# Patient Record
Sex: Female | Born: 1975 | Hispanic: Yes | Marital: Married | Smoking: Never smoker
Health system: Southern US, Community
[De-identification: ages and names within clinical notes are randomized; demographics above are authoritative.]

## PROBLEM LIST (undated history)

## (undated) ENCOUNTER — Emergency Department: Admission: EM

## (undated) DIAGNOSIS — N289 Disorder of kidney and ureter, unspecified: Secondary | ICD-10-CM

## (undated) DIAGNOSIS — I10 Essential (primary) hypertension: Secondary | ICD-10-CM

## (undated) HISTORY — PX: CHOLECYSTECTOMY: SHX55

---

## 2006-07-27 ENCOUNTER — Emergency Department: Payer: Self-pay | Admitting: Emergency Medicine

## 2006-07-27 ENCOUNTER — Other Ambulatory Visit: Payer: Self-pay

## 2016-07-04 ENCOUNTER — Emergency Department
Admission: EM | Admit: 2016-07-04 | Discharge: 2016-07-05 | Disposition: A | Discharge status: Home / Self Care | Payer: Self-pay | Attending: Emergency Medicine | Admitting: Emergency Medicine

## 2016-07-04 ENCOUNTER — Encounter: Payer: Self-pay | Admitting: Emergency Medicine

## 2016-07-04 DIAGNOSIS — R0602 Shortness of breath: Secondary | ICD-10-CM | POA: Insufficient documentation

## 2016-07-04 DIAGNOSIS — R079 Chest pain, unspecified: Secondary | ICD-10-CM | POA: Insufficient documentation

## 2016-07-04 DIAGNOSIS — R55 Syncope and collapse: Secondary | ICD-10-CM | POA: Insufficient documentation

## 2016-07-04 DIAGNOSIS — R000 Tachycardia, unspecified: Secondary | ICD-10-CM | POA: Insufficient documentation

## 2016-07-04 DIAGNOSIS — Z5181 Encounter for therapeutic drug level monitoring: Secondary | ICD-10-CM | POA: Insufficient documentation

## 2016-07-04 DIAGNOSIS — I10 Essential (primary) hypertension: Secondary | ICD-10-CM | POA: Insufficient documentation

## 2016-07-04 HISTORY — DX: Disorder of kidney and ureter, unspecified: N28.9

## 2016-07-04 HISTORY — DX: Essential (primary) hypertension: I10

## 2016-07-04 LAB — CBC WITH DIFFERENTIAL/PLATELET
BASOS ABS: 0 10*3/uL (ref 0–0.1)
BASOS PCT: 0 %
EOS ABS: 0.3 10*3/uL (ref 0–0.7)
Eosinophils Relative: 4 %
HEMATOCRIT: 39.3 % (ref 35.0–47.0)
HEMOGLOBIN: 13.3 g/dL (ref 12.0–16.0)
Lymphocytes Relative: 29 %
Lymphs Abs: 2.2 10*3/uL (ref 1.0–3.6)
MCH: 27.5 pg (ref 26.0–34.0)
MCHC: 33.7 g/dL (ref 32.0–36.0)
MCV: 81.6 fL (ref 80.0–100.0)
MONOS PCT: 7 %
Monocytes Absolute: 0.5 10*3/uL (ref 0.2–0.9)
NEUTROS ABS: 4.7 10*3/uL (ref 1.4–6.5)
NEUTROS PCT: 60 %
Platelets: 285 10*3/uL (ref 150–440)
RBC: 4.82 MIL/uL (ref 3.80–5.20)
RDW: 14.3 % (ref 11.5–14.5)
WBC: 7.8 10*3/uL (ref 3.6–11.0)

## 2016-07-04 LAB — POCT PREGNANCY, URINE: PREG TEST UR: NEGATIVE

## 2016-07-04 MED ORDER — SODIUM CHLORIDE 0.9 % IV BOLUS (SEPSIS)
1000.0000 mL | Freq: Once | INTRAVENOUS | Status: AC
  Administered 2016-07-04: 1000 mL via INTRAVENOUS
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
## 2016-07-05 LAB — URINE DRUG SCREEN, QUALITATIVE (ARMC ONLY)
AMPHETAMINES, UR SCREEN: NOT DETECTED
BENZODIAZEPINE, UR SCRN: POSITIVE — AB
Barbiturates, Ur Screen: NOT DETECTED
Cannabinoid 50 Ng, Ur ~~LOC~~: NOT DETECTED
Cocaine Metabolite,Ur ~~LOC~~: NOT DETECTED
MDMA (Ecstasy)Ur Screen: NOT DETECTED
METHADONE SCREEN, URINE: NOT DETECTED
Opiate, Ur Screen: NOT DETECTED
Phencyclidine (PCP) Ur S: NOT DETECTED
Tricyclic, Ur Screen: NOT DETECTED

## 2016-07-05 LAB — URINALYSIS COMPLETE WITH MICROSCOPIC (ARMC ONLY)
BILIRUBIN URINE: NEGATIVE
GLUCOSE, UA: NEGATIVE mg/dL
KETONES UR: NEGATIVE mg/dL
NITRITE: NEGATIVE
PH: 5 (ref 5.0–8.0)
PROTEIN: 100 mg/dL — AB
Specific Gravity, Urine: 1.016 (ref 1.005–1.030)

## 2016-07-05 LAB — BASIC METABOLIC PANEL
Anion gap: 14 (ref 5–15)
BUN: 13 mg/dL (ref 6–20)
CHLORIDE: 109 mmol/L (ref 101–111)
CO2: 18 mmol/L — AB (ref 22–32)
Calcium: 8.5 mg/dL — ABNORMAL LOW (ref 8.9–10.3)
Creatinine, Ser: 0.78 mg/dL (ref 0.44–1.00)
GFR calc non Af Amer: >60 mL/min (ref >60)
Glucose, Bld: 98 mg/dL (ref 65–99)
POTASSIUM: 3.1 mmol/L — AB (ref 3.5–5.1)
SODIUM: 141 mmol/L (ref 135–145)

## 2016-07-05 LAB — TROPONIN I

## 2016-07-05 MED ORDER — ACETAMINOPHEN 325 MG PO TABS
650.0000 mg | ORAL_TABLET | Freq: Once | ORAL | Status: AC
  Administered 2016-07-05: 650 mg via ORAL
  Filled 2016-07-05: qty 2

## 2016-07-05 NOTE — ED Notes (Signed)
Discharge instructions reviewed with patient. Questions fielded by this RN. Patient verbalizes understanding of instructions. Patient discharged home in stable condition per Goodman MD . No acute distress noted at time of discharge.   

## 2016-07-05 NOTE — ED Notes (Signed)
Report from NordstromKendall RN at [phone]

## 2016-07-05 NOTE — Discharge Instructions (Signed)
Please seek medical attention for any high fevers, chest pain, shortness of breath, change in behavior, persistent vomiting, bloody stool or any other new or concerning symptoms.  

## 2016-07-05 NOTE — ED Notes (Signed)
Pt now c/o of L shoulder pain. Dr. Derrill KayGoodman made aware.

## 2017-01-02 ENCOUNTER — Encounter: Payer: Self-pay | Admitting: Emergency Medicine

## 2017-01-02 ENCOUNTER — Emergency Department
Admission: EM | Admit: 2017-01-02 | Discharge: 2017-01-02 | Disposition: A | Discharge status: Home / Self Care | Payer: Self-pay | Attending: Emergency Medicine | Admitting: Emergency Medicine

## 2017-01-02 ENCOUNTER — Emergency Department: Payer: Self-pay

## 2017-01-02 DIAGNOSIS — J069 Acute upper respiratory infection, unspecified: Secondary | ICD-10-CM | POA: Insufficient documentation

## 2017-01-02 DIAGNOSIS — I10 Essential (primary) hypertension: Secondary | ICD-10-CM | POA: Insufficient documentation

## 2017-01-02 DIAGNOSIS — J111 Influenza due to unidentified influenza virus with other respiratory manifestations: Secondary | ICD-10-CM

## 2017-01-02 DIAGNOSIS — R69 Illness, unspecified: Secondary | ICD-10-CM

## 2017-01-02 DIAGNOSIS — R112 Nausea with vomiting, unspecified: Secondary | ICD-10-CM

## 2017-01-02 DIAGNOSIS — R197 Diarrhea, unspecified: Secondary | ICD-10-CM | POA: Insufficient documentation

## 2017-01-02 LAB — INFLUENZA PANEL BY PCR (TYPE A & B)
Influenza A By PCR: NEGATIVE
Influenza B By PCR: NEGATIVE

## 2017-01-02 MED ORDER — IBUPROFEN 800 MG PO TABS
800.0000 mg | ORAL_TABLET | Freq: Once | ORAL | Status: AC
  Administered 2017-01-02: 800 mg via ORAL
  Filled 2017-01-02: qty 1

## 2017-01-02 MED ORDER — ACETAMINOPHEN 500 MG PO TABS
1000.0000 mg | ORAL_TABLET | Freq: Once | ORAL | Status: AC
  Administered 2017-01-02: 1000 mg via ORAL
  Filled 2017-01-02: qty 2

## 2017-01-02 MED ORDER — BENZONATATE 100 MG PO CAPS: 100.0000 mg | ORAL_CAPSULE | Freq: Four times a day (QID) | ORAL | 0 refills | Status: DC | PRN

## 2017-01-02 MED ORDER — ONDANSETRON 4 MG PO TBDP: 4.0000 mg | ORAL_TABLET | Freq: Three times a day (TID) | ORAL | 0 refills | Status: DC | PRN

## 2017-01-02 NOTE — ED Triage Notes (Signed)
Pt presents to ED with frequent non-productive cough, fever, headache, and body aches. Onset last night before bed.

## 2017-01-02 NOTE — ED Provider Notes (Signed)
[facility] Emergency Department Provider Note   ____________________________________________   First MD Initiated Contact with Patient 01/02/17 [phone]     (approximate)  I have reviewed the triage vital signs and the nursing notes.   HISTORY  Chief Complaint Fever; Generalized Body Aches; Headache; and Cough    HPI Michaela Chambers is a 41 y.o. female who comes into the hospital today with high fever, body aches, headache, sore throat, nausea vomiting and diarrhea. The patient reports that the symptoms started last night. The diarrhea started Sunday. She has not taken anything for pain or for fever at home. She has had blood pressure and doesn't want the patient to cause her blood pressure to be higher. The patient reports that her temperature has been up to 105. She's had some occasional shortness of breath. The patient denies any sick contacts. She's had a cough. She is here today for evaluation.   Past Medical History:  Diagnosis Date  . Hypertension   . Renal disorder     There are no active problems to display for this patient.   Past Surgical History:  Procedure Laterality Date  . CESAREAN SECTION      Prior to Admission medications   Medication Sig Start Date End Date Taking? Authorizing Provider  benzonatate (TESSALON PERLES) 100 MG capsule Take 1 capsule (100 mg total) by mouth every 6 (six) hours as needed for cough. 01/02/17   Rebecka ApleyAllison P Webster, MD  ondansetron (ZOFRAN ODT) 4 MG disintegrating tablet Take 1 tablet (4 mg total) by mouth every 8 (eight) hours as needed for nausea or vomiting. 01/02/17   Rebecka ApleyAllison P Webster, MD    Allergies Pineapple  No family history on file.  Social History Social History  Substance Use Topics  . Smoking status: Never Smoker  . Smokeless tobacco: Never Used  . Alcohol use Yes    Review of Systems Constitutional:  fever/chills Eyes: No visual changes. ENT: No sore throat. Cardiovascular: Denies  chest pain. Respiratory: cough and shortness of breath. Gastrointestinal: Nausea, vomiting and diarrhea with No abdominal pain. No constipation. Genitourinary: Negative for dysuria. Musculoskeletal: body aches Skin: Negative for rash. Neurological: headache  10-point ROS otherwise negative.  ____________________________________________   PHYSICAL EXAM:  VITAL SIGNS: ED Triage Vitals  Enc Vitals Group     BP 01/02/17 0505 (!) 146/102     Pulse Rate 01/02/17 0505 86     Resp 01/02/17 0505 20     Temp 01/02/17 0505 (!) 100.4 F (38 C)     Temp Source 01/02/17 0505 Oral     SpO2 01/02/17 0505 97 %     Weight 01/02/17 0506 210 lb (95.3 kg)     Height 01/02/17 0506 5\' 8"  (1.727 m)     Head Circumference --      Peak Flow --      Pain Score 01/02/17 0506 10     Pain Loc --      Pain Edu? --      Excl. in GC? --     Constitutional: Alert and oriented. Ill appearing and in moderate distress. Eyes: Conjunctivae are normal. PERRL. EOMI. Head: Atraumatic. Nose: No congestion/rhinnorhea. Mouth/Throat: Mucous membranes are moist.  Oropharynx non-erythematous. Cardiovascular: Normal rate, regular rhythm. Grossly normal heart sounds.  Good peripheral circulation. Respiratory: Normal respiratory effort.  No retractions. Lungs CTAB. Gastrointestinal: Soft and nontender. No distention. Positive bowel sounds Musculoskeletal: No lower extremity tenderness nor edema.   Neurologic:  Normal speech and language.  Skin:  Skin is warm, dry and intact.  Psychiatric: Mood and affect are normal.   ____________________________________________   LABS (all labs ordered are listed, but only abnormal results are displayed)  Labs Reviewed  INFLUENZA PANEL BY PCR (TYPE A & B)   ____________________________________________  EKG  none ____________________________________________  RADIOLOGY  CXR ____________________________________________   PROCEDURES  Procedure(s) performed:  None  Procedures  Critical Care performed: No  ____________________________________________   INITIAL IMPRESSION / ASSESSMENT AND PLAN / ED COURSE  Pertinent labs & imaging results that were available during my care of the patient were reviewed by me and considered in my medical decision making (see chart for details).  This is a 41 year old female who comes into the hospital today with flulike symptoms. She's having fever bodyaches headache sore throat. The patient reports her temp was really high at 105. She was 100.4 here in the emergency department. We did give her a dose of ibuprofen and Tylenol. I will send the patient for chest x-ray as well as a flu swab. I will reassess the patient.  Clinical Course as of Jan 03 652  Tue Jan 02, 2017  0614 No active cardiopulmonary disease. DG Chest 2 View [AW]    Clinical Course User Index [AW] Rebecka Apley, MD   The patient's chest x-ray does not show pneumonia and her flu swab was negative. She'll be discharged home to follow-up with her primary care physician or the acute care clinic.  ____________________________________________   FINAL CLINICAL IMPRESSION(S) / ED DIAGNOSES  Final diagnoses:  Influenza-like illness  Viral upper respiratory tract infection  Nausea vomiting and diarrhea      NEW MEDICATIONS STARTED DURING THIS VISIT:  New Prescriptions   BENZONATATE (TESSALON PERLES) 100 MG CAPSULE    Take 1 capsule (100 mg total) by mouth every 6 (six) hours as needed for cough.   ONDANSETRON (ZOFRAN ODT) 4 MG DISINTEGRATING TABLET    Take 1 tablet (4 mg total) by mouth every 8 (eight) hours as needed for nausea or vomiting.     Note:  This document was prepared using Dragon voice recognition software and may include unintentional dictation errors.    Rebecka Apley, MD 01/02/17 [phone]

## 2017-01-08 ENCOUNTER — Encounter: Payer: Self-pay | Admitting: Emergency Medicine

## 2017-01-08 ENCOUNTER — Emergency Department
Admission: EM | Admit: 2017-01-08 | Discharge: 2017-01-08 | Disposition: A | Discharge status: Home / Self Care | Payer: Self-pay | Attending: Emergency Medicine | Admitting: Emergency Medicine

## 2017-01-08 DIAGNOSIS — Z791 Long term (current) use of non-steroidal anti-inflammatories (NSAID): Secondary | ICD-10-CM | POA: Insufficient documentation

## 2017-01-08 DIAGNOSIS — R112 Nausea with vomiting, unspecified: Secondary | ICD-10-CM

## 2017-01-08 DIAGNOSIS — I10 Essential (primary) hypertension: Secondary | ICD-10-CM | POA: Insufficient documentation

## 2017-01-08 DIAGNOSIS — F1010 Alcohol abuse, uncomplicated: Secondary | ICD-10-CM | POA: Insufficient documentation

## 2017-01-08 DIAGNOSIS — K2920 Alcoholic gastritis without bleeding: Secondary | ICD-10-CM | POA: Insufficient documentation

## 2017-01-08 LAB — COMPREHENSIVE METABOLIC PANEL
ALK PHOS: 65 U/L (ref 38–126)
ALT: 33 U/L (ref 14–54)
AST: 29 U/L (ref 15–41)
Albumin: 4.2 g/dL (ref 3.5–5.0)
Anion gap: 9 (ref 5–15)
BUN: 13 mg/dL (ref 6–20)
CALCIUM: 8.4 mg/dL — AB (ref 8.9–10.3)
CHLORIDE: 106 mmol/L (ref 101–111)
CO2: 25 mmol/L (ref 22–32)
CREATININE: 0.72 mg/dL (ref 0.44–1.00)
GFR calc non Af Amer: >60 mL/min (ref >60)
GLUCOSE: 116 mg/dL — AB (ref 65–99)
Potassium: 3.9 mmol/L (ref 3.5–5.1)
SODIUM: 140 mmol/L (ref 135–145)
Total Bilirubin: 1.4 mg/dL — ABNORMAL HIGH (ref 0.3–1.2)
Total Protein: 7.5 g/dL (ref 6.5–8.1)

## 2017-01-08 LAB — URINALYSIS, COMPLETE (UACMP) WITH MICROSCOPIC
Bacteria, UA: NONE SEEN
Bilirubin Urine: NEGATIVE
GLUCOSE, UA: NEGATIVE mg/dL
Hgb urine dipstick: NEGATIVE
KETONES UR: 5 mg/dL — AB
Nitrite: NEGATIVE
PH: 6 (ref 5.0–8.0)
Protein, ur: NEGATIVE mg/dL
SPECIFIC GRAVITY, URINE: 1.021 (ref 1.005–1.030)

## 2017-01-08 LAB — POCT PREGNANCY, URINE: PREG TEST UR: NEGATIVE

## 2017-01-08 LAB — CBC
HCT: 39.2 % (ref 35.0–47.0)
Hemoglobin: 13.6 g/dL (ref 12.0–16.0)
MCH: 27.6 pg (ref 26.0–34.0)
MCHC: 34.8 g/dL (ref 32.0–36.0)
MCV: 79.4 fL — AB (ref 80.0–100.0)
PLATELETS: 284 10*3/uL (ref 150–440)
RBC: 4.94 MIL/uL (ref 3.80–5.20)
RDW: 14.3 % (ref 11.5–14.5)
WBC: 7.5 10*3/uL (ref 3.6–11.0)

## 2017-01-08 LAB — ETHANOL: Alcohol, Ethyl (B): <5 mg/dL (ref <5)

## 2017-01-08 LAB — LIPASE, BLOOD: Lipase: 24 U/L (ref 11–51)

## 2017-01-08 MED ORDER — FAMOTIDINE IN NACL 20-0.9 MG/50ML-% IV SOLN
20.0000 mg | Freq: Once | INTRAVENOUS | Status: AC
  Administered 2017-01-08: 20 mg via INTRAVENOUS
  Filled 2017-01-08: qty 50

## 2017-01-08 MED ORDER — ONDANSETRON HCL 4 MG/2ML IJ SOLN
4.0000 mg | Freq: Once | INTRAMUSCULAR | Status: AC | PRN
  Administered 2017-01-08: 4 mg via INTRAVENOUS
  Filled 2017-01-08: qty 2

## 2017-01-08 MED ORDER — SODIUM CHLORIDE 0.9 % IV BOLUS (SEPSIS)
1000.0000 mL | Freq: Once | INTRAVENOUS | Status: AC
  Administered 2017-01-08: 1000 mL via INTRAVENOUS

## 2017-01-08 MED ORDER — ONDANSETRON HCL 4 MG/2ML IJ SOLN
4.0000 mg | Freq: Once | INTRAMUSCULAR | Status: AC
  Administered 2017-01-08: 4 mg via INTRAVENOUS
  Filled 2017-01-08: qty 2

## 2017-01-08 MED ORDER — GI COCKTAIL ~~LOC~~
30.0000 mL | Freq: Once | ORAL | Status: AC
  Administered 2017-01-08: 30 mL via ORAL
  Filled 2017-01-08: qty 30

## 2017-01-08 MED ORDER — FAMOTIDINE 20 MG PO TABS: 20.0000 mg | ORAL_TABLET | Freq: Two times a day (BID) | ORAL | 1 refills | Status: DC

## 2017-01-08 MED ORDER — ONDANSETRON HCL 4 MG PO TABS: 4.0000 mg | ORAL_TABLET | Freq: Three times a day (TID) | ORAL | 0 refills | Status: DC | PRN

## 2017-01-08 MED ORDER — ACETAMINOPHEN 500 MG PO TABS
1000.0000 mg | ORAL_TABLET | Freq: Once | ORAL | Status: AC
  Administered 2017-01-08: 1000 mg via ORAL
  Filled 2017-01-08: qty 2

## 2017-01-08 NOTE — ED Triage Notes (Signed)
Pt assisted to wheelchair upon arrival; at stat registration desk pt began vomiting; head resting on counter; husband says pt was drinking alcohol last night and he thinks she "over did it";
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
## 2019-01-20 ENCOUNTER — Other Ambulatory Visit: Payer: Self-pay

## 2019-01-20 ENCOUNTER — Ambulatory Visit
Admission: EM | Admit: 2019-01-20 | Discharge: 2019-01-20 | Disposition: A | Discharge status: Home / Self Care | Payer: 59 | Attending: Internal Medicine | Admitting: Internal Medicine

## 2019-01-20 ENCOUNTER — Encounter: Payer: Self-pay | Admitting: Emergency Medicine

## 2019-01-20 DIAGNOSIS — G43009 Migraine without aura, not intractable, without status migrainosus: Secondary | ICD-10-CM

## 2019-01-20 DIAGNOSIS — A084 Viral intestinal infection, unspecified: Secondary | ICD-10-CM

## 2019-01-20 LAB — PREGNANCY, URINE: Preg Test, Ur: NEGATIVE

## 2019-01-20 MED ORDER — SUMATRIPTAN SUCCINATE 100 MG PO TABS: ORAL_TABLET | ORAL | 0 refills | Status: DC

## 2019-01-20 MED ORDER — ONDANSETRON HCL 4 MG PO TABS: 4.0000 mg | ORAL_TABLET | Freq: Three times a day (TID) | ORAL | 0 refills | Status: DC | PRN

## 2019-01-20 MED ORDER — SUMATRIPTAN SUCCINATE 100 MG PO TABS: 100.0000 mg | ORAL_TABLET | ORAL | 0 refills | Status: DC | PRN

## 2019-01-20 NOTE — ED Triage Notes (Signed)
Patient in today c/o headache, chills, felt feverish, diarrhea x 2 days. Patient has tried OTC Dayquil.

## 2019-01-20 NOTE — ED Provider Notes (Addendum)
[facility]    CSN: [id number] Arrival date & time: 01/20/19  1348     History   Chief Complaint Chief Complaint  Patient presents with  . Headache    HPI Michaela Chambers is a 43 y.o. female comes to the urgent care department on account of subjective fever, chills headache and diarrhea of 2 days duration.  Patient has about 2-3 episodes of diarrhea per day over the past couple of days.  Last bowel movement was this morning.  Stool was nonbloody.  She has not tried any over-the-counter medications.  She has associated nausea with no vomiting.  She complains of crampy abdominal pain.  She also complains of a headache that is unilateral and retro-orbital.  It is associated with non-colored floaters in her visual field.  She has a history of migraines.  No relieving factors.  No known aggravating factors. HPI  Past Medical History:  Diagnosis Date  . Hypertension   . Renal disorder     There are no active problems to display for this patient.   Past Surgical History:  Procedure Laterality Date  . CESAREAN SECTION      OB History   No obstetric history on file.      Home Medications    Prior to Admission medications   Medication Sig Start Date End Date Taking? Authorizing Provider  benzonatate (TESSALON PERLES) 100 MG capsule Take 1 capsule (100 mg total) by mouth every 6 (six) hours as needed for cough. 01/02/17   Rebecka ApleyWebster, Allison P, MD  famotidine (PEPCID) 20 MG tablet Take 1 tablet (20 mg total) by mouth 2 (two) times daily. 01/08/17 01/08/18  Nita SickleVeronese, [place], MD  ibuprofen (ADVIL,MOTRIN) 200 MG tablet Take 200 mg by mouth every 6 (six) hours as needed.    [provider]  ondansetron (ZOFRAN ODT) 4 MG disintegrating tablet Take 1 tablet (4 mg total) by mouth every 8 (eight) hours as needed for nausea or vomiting. 01/02/17   Rebecka ApleyWebster, Allison P, MD  ondansetron (ZOFRAN) 4 MG tablet Take 1 tablet (4 mg total) by mouth every 8 (eight) hours as needed for  nausea or vomiting. 01/08/17   Nita SickleVeronese, [place], MD    Family History Family History  Problem Relation Age of Onset  . Diabetes Mother   . Obesity Mother   . Hyperlipidemia Mother   . Hypertension Mother   . Other Father        MVA    Social History Social History   Tobacco Use  . Smoking status: Never Smoker  . Smokeless tobacco: Never Used  Substance Use Topics  . Alcohol use: Yes    Comment: "not often"  . Drug use: No     Allergies   Pineapple   Review of Systems Review of Systems  Constitutional: Positive for fatigue. Negative for activity change, appetite change, chills and fever.  HENT: Negative for congestion, ear discharge, ear pain and postnasal drip.   Eyes: Negative for pain, discharge and itching.  Cardiovascular: Negative for chest pain and palpitations.  Gastrointestinal: Positive for abdominal pain. Negative for abdominal distention, blood in stool, constipation and diarrhea.  Genitourinary: Negative for difficulty urinating, genital sores and urgency.  Musculoskeletal: Negative for arthralgias and myalgias.  Allergic/Immunologic: Negative for environmental allergies.  Neurological: Negative for dizziness, weakness and light-headedness.  Hematological: Negative for adenopathy.  Psychiatric/Behavioral: Negative for agitation and confusion.     Physical Exam Triage Vital Signs ED Triage Vitals  Enc Vitals Group  BP 01/20/19 1421 (!) 145/93     Pulse Rate 01/20/19 1421 76     Resp 01/20/19 1421 16     Temp 01/20/19 1421 98.4 F (36.9 C)     Temp Source 01/20/19 1421 Oral     SpO2 01/20/19 1421 100 %     Weight 01/20/19 1421 200 lb (90.7 kg)     Height 01/20/19 1421 5\' 7"  (1.702 m)     Head Circumference --      Peak Flow --      Pain Score 01/20/19 1420 10     Pain Loc --      Pain Edu? --      Excl. in GC? --    No data found.  Updated Vital Signs BP (!) 145/93 (BP Location: Right Arm)   Pulse 76   Temp 98.4 F (36.9 C) (Oral)    Resp 16   Ht 5\' 7"  (1.702 m)   Wt 90.7 kg   LMP 12/18/2018 (Exact Date)   SpO2 100%   BMI 31.32 kg/m   Visual Acuity Right Eye Distance:   Left Eye Distance:   Bilateral Distance:    Right Eye Near:   Left Eye Near:    Bilateral Near:     Physical Exam Constitutional:      Appearance: She is well-developed. She is not ill-appearing.  HENT:     Mouth/Throat:     Mouth: Mucous membranes are moist.     Pharynx: Oropharynx is clear.  Eyes:     Extraocular Movements: Extraocular movements intact.  Neck:     Musculoskeletal: Normal range of motion and neck supple.  Cardiovascular:     Rate and Rhythm: Normal rate and regular rhythm.     Heart sounds: Normal heart sounds. No murmur. No gallop.   Pulmonary:     Effort: Pulmonary effort is normal. No respiratory distress.     Breath sounds: Normal breath sounds. No wheezing.  Abdominal:     General: Bowel sounds are normal.     Palpations: Abdomen is soft. There is no mass.     Tenderness: There is no guarding.  Musculoskeletal: Normal range of motion.  Skin:    General: Skin is warm and dry.     Capillary Refill: Capillary refill takes less than 2 seconds.  Neurological:     Mental Status: She is alert.     GCS: GCS eye subscore is 4. GCS verbal subscore is 5. GCS motor subscore is 6.     Cranial Nerves: No cranial nerve deficit.  Psychiatric:        Mood and Affect: Mood normal.      UC Treatments / Results  Labs (all labs ordered are listed, but only abnormal results are displayed) Labs Reviewed  PREGNANCY, URINE    EKG None  Radiology No results found.  Procedures Procedures (including critical care time)  Medications Ordered in UC Medications - No data to display  Initial Impression / Assessment and Plan / UC Course  I have reviewed the triage vital signs and the nursing notes.  Pertinent labs & imaging results that were available during my care of the patient were reviewed by me and considered  in my medical decision making (see chart for details).     1.  Acute viral gastroenteritis: Zofran as needed for nausea Encourage electrolyte balance oral fluid intake Probiotics  2.  Acute migraine with aura: Imitrex  Final Clinical Impressions(s) / UC Diagnoses  Final diagnoses:  None   Discharge Instructions   None    ED Prescriptions    None     Controlled Substance Prescriptions [place] Controlled Substance Registry consulted? No   Merrilee Jansky, MD 01/20/19 1606    Merrilee Jansky, MD 01/20/19 [phone]

## 2019-04-29 ENCOUNTER — Encounter: Payer: Self-pay | Admitting: Emergency Medicine

## 2019-04-29 ENCOUNTER — Other Ambulatory Visit: Payer: Self-pay

## 2019-04-29 ENCOUNTER — Ambulatory Visit
Admission: EM | Admit: 2019-04-29 | Discharge: 2019-04-29 | Disposition: A | Discharge status: Home / Self Care | Payer: 59 | Attending: Family Medicine | Admitting: Family Medicine

## 2019-04-29 DIAGNOSIS — A084 Viral intestinal infection, unspecified: Secondary | ICD-10-CM | POA: Diagnosis not present

## 2019-04-29 MED ORDER — LOPERAMIDE HCL 2 MG PO CAPS: 2.0000 mg | ORAL_CAPSULE | Freq: Four times a day (QID) | ORAL | 0 refills | Status: DC | PRN

## 2019-04-29 MED ORDER — ONDANSETRON 8 MG PO TBDP
8.0000 mg | ORAL_TABLET | Freq: Once | ORAL | Status: AC
  Administered 2019-04-29: 8 mg via ORAL

## 2019-04-29 MED ORDER — LOPERAMIDE HCL 2 MG PO CAPS
4.0000 mg | ORAL_CAPSULE | Freq: Once | ORAL | Status: AC
  Administered 2019-04-29: 4 mg via ORAL

## 2019-04-29 MED ORDER — ONDANSETRON 4 MG PO TBDP: 4.0000 mg | ORAL_TABLET | Freq: Three times a day (TID) | ORAL | 0 refills | Status: DC | PRN

## 2019-04-29 NOTE — ED Provider Notes (Signed)
[address] [place], [place] [address] [phone]   Name: Michaela Chambers DOB: [birth year] MRN: [id number] CSN: [id number] PCP: Patient, No Pcp Per  Arrival date and time:  04/29/19 1243  Chief Complaint:  Emesis and Diarrhea  NOTE: Prior to seeing the patient today, I have reviewed the triage nursing documentation and vital signs. Clinical staff has updated patient's PMH/PSHx, current medication list, and drug allergies/intolerances to ensure comprehensive history available to assist in medical decision making.   History:   HPI: Michaela Chambers is a 43 y.o. female who presents today with complaints of nausea vomiting diarrhea that started early on Sunday morning.  Patient experienced a fever last night (Tmax 101.3) that was responsive to antipyretics. Patient has not had any fever today.  Patient notes that her oral intake has been poor overall secondary to her nausea.  Patient states, "if I eat anything, I feel like I am going to throw up and I have diarrhea".  Patient denies associated abdominal pain. Patient has only experienced 2 episodes of diarrhea today.  Stools were not noted to contain mucus or blood.  Patient denies any recent illness requiring treatment with antibiotic therapy.  Patient denies any known sick contacts.   Past Medical History:  Diagnosis Date  . Hypertension   . Renal disorder     Past Surgical History:  Procedure Laterality Date  . CESAREAN SECTION    . CHOLECYSTECTOMY      Family History  Problem Relation Age of Onset  . Diabetes Mother   . Obesity Mother   . Hyperlipidemia Mother   . Hypertension Mother   . Other Father        MVA    Social History   Socioeconomic History  . Marital status: Married    Spouse name: Not on file  . Number of children: Not on file  . Years of education: Not on file  . Highest education level: Not on file  Occupational History  . Not on file  Social Needs  . Financial resource strain: Not on file   . Food insecurity:    Worry: Not on file    Inability: Not on file  . Transportation needs:    Medical: Not on file    Non-medical: Not on file  Tobacco Use  . Smoking status: Never Smoker  . Smokeless tobacco: Never Used  Substance and Sexual Activity  . Alcohol use: Not Currently    Comment: "not often"  . Drug use: No  . Sexual activity: Yes    Birth control/protection: None  Lifestyle  . Physical activity:    Days per week: Not on file    Minutes per session: Not on file  . Stress: Not on file  Relationships  . Social connections:    Talks on phone: Not on file    Gets together: Not on file    Attends religious service: Not on file    Active member of club or organization: Not on file    Attends meetings of clubs or organizations: Not on file    Relationship status: Not on file  . Intimate partner violence:    Fear of current or ex partner: Not on file    Emotionally abused: Not on file    Physically abused: Not on file    Forced sexual activity: Not on file  Other Topics Concern  . Not on file  Social History Narrative  . Not on file    There are no active  problems to display for this patient.   Home Medications:    No outpatient medications have been marked as taking for the 04/29/19 encounter [facility](Hospital Encounter).    Allergies:   Pineapple  Review of Systems (ROS): Review of Systems  Constitutional: Positive for activity change, appetite change (decreased), fatigue and fever (Tmax 101.3). Negative for chills.  HENT: Negative for congestion, rhinorrhea and sore throat.   Respiratory: Negative for cough and shortness of breath.   Cardiovascular: Negative for chest pain and palpitations.  Gastrointestinal: Positive for diarrhea, nausea and vomiting. Negative for abdominal pain and blood in stool.  Genitourinary: Negative for dysuria, frequency, hematuria, pelvic pain and urgency.  Musculoskeletal: Negative for arthralgias, back pain and myalgias.  Skin:  Negative for color change and pallor.  Neurological: Positive for dizziness and weakness (generalized). Negative for headaches.     Physical Exam:  Triage Vital Signs ED Triage Vitals  Enc Vitals Group     BP 04/29/19 1306 (!) 128/94     Pulse Rate 04/29/19 1306 68     Resp 04/29/19 1306 18     Temp 04/29/19 1305 98.4 F (36.9 C)     Temp Source 04/29/19 1305 Oral     SpO2 04/29/19 1306 100 %     Weight 04/29/19 1306 210 lb (95.3 kg)     Height 04/29/19 1306 5\' 6"  (1.676 m)     Head Circumference --      Peak Flow --      Pain Score 04/29/19 1306 9     Pain Loc --      Pain Edu? --      Excl. in GC? --     Physical Exam  Constitutional: She is oriented to person, place, and time and well-developed, well-nourished, and in no distress.  HENT:  Head: Normocephalic and atraumatic.  Right Ear: External ear normal.  Left Ear: External ear normal.  Mouth/Throat: Oropharynx is clear and moist and mucous membranes are normal.  Eyes: Pupils are equal, round, and reactive to light. EOM are normal.  Neck: Normal range of motion. Neck supple. No tracheal deviation present.  Cardiovascular: Normal rate, regular rhythm, normal heart sounds and intact distal pulses. Exam reveals no gallop and no friction rub.  No murmur heard. Pulmonary/Chest: Effort normal and breath sounds normal. No respiratory distress. She has no wheezes. She has no rales.  Abdominal: Soft. There is no abdominal tenderness.  Lymphadenopathy:    She has no cervical adenopathy.  Neurological: She is alert and oriented to person, place, and time. She has normal sensation, normal reflexes and intact cranial nerves. GCS score is 15.  Skin: Skin is warm and dry. No rash noted. No erythema.  Psychiatric: Mood, affect and judgment normal.  Nursing note and vitals reviewed.    Urgent Care Treatments / Results:   LABS: PLEASE NOTE: all labs that were ordered this encounter are listed, however only abnormal results are  displayed. Labs Reviewed - No data to display  EKG: -None  RADIOLOGY: No results found.  PROCEDURES: Procedures  MEDICATIONS RECEIVED THIS VISIT: Medications  loperamide (IMODIUM) capsule 4 mg (4 mg Oral Given 04/29/19 1328)  ondansetron (ZOFRAN-ODT) disintegrating tablet 8 mg (8 mg Oral Given 04/29/19 1329)    PERTINENT CLINICAL COURSE NOTES/UPDATES: Clinical Course as of Apr 28 1406  Tue Apr 29, 2019  1343 Reassessment. Patient feeling better after antiemetic dose. Sipping on clear liquids. Will discharge home.    [BG]    Clinical Course User  Index [BG] Verlee MonteGray, Tremane Spurgeon E, NP   Initial Impression / Assessment and Plan / Urgent Care Course:    Erin SonsCarla Martinez is a 43 y.o. female who [facility] with complaints of Emesis and Diarrhea  Pertinent labs & imaging results that were available during my care of the patient were personally reviewed by me and considered in my medical decision making (see lab/imaging section of note for values and interpretations).  Patient overall well appearing and in no acute distress today in clinic.  She presents to the clinic normotensive with a normal heart rate of 68.  Symptoms improved following administration of oral ondansetron 8 mg ODT.  Patient sipping on clear fluids in the clinic.  Patient denies any recent sick contacts or antibiotic use.  Concern for infectious diarrhea low.  Patient treated with 4 mg dose of oral loperamide.  Symptoms consistent with a viral gastroenteritis.  Prescription sent in for antiemetics and antidiarrheal medication.  Patient encouraged to increase fluid intake, incorporating electrolyte enriched fluids into her daily intake to prevent dehydration and electrolyte imbalances.  Current clinical condition warrants patient being out of work in order to recover from her current injury/illness. She was provided with the appropriate documentation to provide to her place of employment that will allow for her  to RTW on 05/01/2019 with no restrictions.   Discussed follow up with primary care physician in 1 week for re-evaluation. I have reviewed the follow up and strict return precautions for any new or worsening symptoms. Patient is aware of symptoms that would be deemed urgent/emergent, and would thus require further evaluation either here or in the emergency department. At the time of discharge, she verbalized understanding and consent with the discharge plan as it was reviewed with her. All questions were fielded by provider and/or clinic staff prior to patient discharge.    Final Clinical Impressions(s) / Urgent Care Diagnoses:   Final diagnoses:  Viral gastroenteritis    New Prescriptions:   Meds ordered this encounter  Medications  . loperamide (IMODIUM) capsule 4 mg  . ondansetron (ZOFRAN-ODT) disintegrating tablet 8 mg  . loperamide (IMODIUM) 2 MG capsule    Sig: Take 1 capsule (2 mg total) by mouth 4 (four) times daily as needed for diarrhea or loose stools.    Dispense:  12 capsule    Refill:  0  . ondansetron (ZOFRAN-ODT) 4 MG disintegrating tablet    Sig: Take 1 tablet (4 mg total) by mouth every 8 (eight) hours as needed for nausea or vomiting.    Dispense:  15 tablet    Refill:  0    Controlled Substance Prescriptions:  [place] Controlled Substance Registry consulted? Not Applicable  NOTE: This note was prepared using Dragon dictation software along with smaller phrase technology. Despite my best ability to proofread, there is the potential that transcriptional errors may still occur from this process, and are completely unintentional.     Verlee MonteGray, Jesselle Laflamme E, NP 04/29/19 [phone]

## 2019-04-29 NOTE — ED Triage Notes (Addendum)
Patient c/o vomiting and diarrhea that started early Sunday am. Vomiting resolved yesterday. Patient does report nausea and states she has continued to have diarrhea. Patient reports fever of 101.3 last night.

## 2019-04-29 NOTE — Discharge Instructions (Addendum)
It was very nice seeing you today in clinic. Thank you for entrusting me with your care.   Please utilize the medications that we discussed. Your prescriptions have been called in to your pharmacy.  Increase fluid intake as much as possible. Water is always best, as sugar and caffeine containing fluids can cause you to become dehydrated. Try to incorporate electrolyte enriched fluids, such as Gatorade or Pedialyte, into your daily fluid intake.   Make arrangements to follow up with your PCP in 1 week for re-evaluation. If your symptoms/condition worsens, please seek follow up care either here or in the ER. Please remember, our [facility] providers are "right here with you" when you need [place].   Again, it was my pleasure to take care of you today. Thank you for choosing our clinic. I hope that you start to feel better quickly.   Honor Loh, MSN, APRN, FNP-C, CEN Advanced Practice Provider [facility]

## 2022-01-03 ENCOUNTER — Ambulatory Visit (INDEPENDENT_AMBULATORY_CARE_PROVIDER_SITE_OTHER): Payer: BC Managed Care – PPO

## 2022-01-03 ENCOUNTER — Ambulatory Visit
Admission: EM | Admit: 2022-01-03 | Discharge: 2022-01-03 | Payer: BC Managed Care – PPO | Attending: Emergency Medicine | Admitting: Emergency Medicine

## 2022-01-03 ENCOUNTER — Other Ambulatory Visit: Payer: Self-pay

## 2022-01-03 DIAGNOSIS — R059 Cough, unspecified: Secondary | ICD-10-CM

## 2022-01-03 DIAGNOSIS — R079 Chest pain, unspecified: Secondary | ICD-10-CM

## 2022-01-03 DIAGNOSIS — J0140 Acute pansinusitis, unspecified: Secondary | ICD-10-CM | POA: Diagnosis not present

## 2022-01-03 MED ORDER — AMOXICILLIN-POT CLAVULANATE 875-125 MG PO TABS
1.0000 | ORAL_TABLET | Freq: Two times a day (BID) | ORAL | 0 refills | Status: AC
Start: 2022-01-03 — End: 2022-01-13

## 2022-01-03 NOTE — Discharge Instructions (Addendum)
I believe your fevers and other symptoms are coming from a sinus infection, plus or minus an ear infection.  I am sending you home with Augmentin, which will cover both.  You can use Mucinex, Flonase, saline nasal irrigation with a NeilMed sinus rinse and distilled water as often as you want to help clear out the sinus infection.  Please go immediately to the emergency department.  I am concerned that your chest pain could be due to your heart.  Let them know if your chest pain comes back.

## 2022-01-03 NOTE — ED Triage Notes (Signed)
Pt c/o body aches, chills, cough with yellow phlegm, nasal congestion, temperature of 101 x6days.  Pt has taken OTC cold medication.  Pt bp was 151/105. Pt states that she is having headache, blurry vision, and dizziness.

## 2022-01-03 NOTE — ED Notes (Signed)
Patient is being discharged from the Urgent Care and sent to the Emergency Department via POV . Per Chaney Malling, MD, patient is in need of higher level of care due to further evaluation of chest pain. Patient is aware and verbalizes understanding of plan of care.  Vitals:   01/03/22 1710  BP: (!) 151/105  Pulse: 75  Resp: 18  Temp: 98.7 F (37.1 C)  SpO2: 98%

## 2022-01-03 NOTE — ED Provider Notes (Addendum)
HPI  SUBJECTIVE:  Michaela Chambers is a 46 y.o. female who presents with 2 issues: First, she reports 6 days of fevers Tmax 101, chills, body aches, chest congestion, headache, nasal congestion, sinus pain and pressure, yellowish rhinorrhea and a cough productive of the same material as her rhinorrhea.  She reports wheezing at night.  She also reports right ear pain, but this has been present for the past month.  no shortness of breath, dyspnea on exertion.  She has been taking over-the-counter cold and flu medications, ibuprofen 800 mg and DayQuil.  The ibuprofen helps very temporarily.  No aggravating factors.  She had a second dose of COVID-vaccine.  She did not get this years flu vaccine.  She was on amoxicillin for sore throat within the past month.  No antipyretic in the past 6 hours.  Second, she reports left-sided chest pressure with radiation into her left arm for the past week.  States that she was having this prior to getting sick.  It lasts seconds, it is worse with going up stairs and better with resting.  She has never had symptoms like this before.  She denies accompanying nausea, diaphoresis.  She reports generalized weakness over the past week, lightheadedness/dizziness.  No anuria, hematuria, lower extremity edema, slurred speech, arm or leg weakness.  She reports blurry vision, but this has been constant over the past 3 months and has not changed recently.  She has a past medical history of hypertension, ran out of her lisinopril yesterday.  She states that her blood pressure is "high all the time" around 140/97 on average.  She has not been measuring it over the past week.  No history of GERD, MI, coronary disease, stroke, PAD/PVD, pulm disease, hypercholesterolemia, smoking.  Family history significant for MI with mother in her 43s, grandmother at age 3.    Past Medical History:  Diagnosis Date   Hypertension    Renal disorder     Past Surgical History:  Procedure Laterality Date    CESAREAN SECTION     CHOLECYSTECTOMY      Family History  Problem Relation Age of Onset   Diabetes Mother    Obesity Mother    Hyperlipidemia Mother    Hypertension Mother    Other Father        MVA    Social History   Tobacco Use   Smoking status: Never   Smokeless tobacco: Never  Vaping Use   Vaping Use: Never used  Substance Use Topics   Alcohol use: Not Currently    Comment: "not often"   Drug use: No    No current facility-administered medications for this encounter.  Current Outpatient Medications:    amoxicillin-clavulanate (AUGMENTIN) 875-125 MG tablet, Take 1 tablet by mouth 2 (two) times daily for 10 days., Disp: 20 tablet, Rfl: 0  Allergies  Allergen Reactions   Pineapple Anaphylaxis     ROS  As noted in HPI.   Physical Exam  BP (!) 151/105 (BP Location: Left Arm)    Pulse 75    Temp 98.7 F (37.1 C) (Tympanic)    Resp 18    Ht 5\' 7"  (1.702 m)    Wt 99.8 kg    LMP 01/01/2022    SpO2 98%    BMI 34.46 kg/m   Constitutional: Well developed, well nourished, no acute distress Eyes:  EOMI, conjunctiva normal bilaterally HENT: Normocephalic, atraumatic,mucus membranes moist.  Positive nasal congestion.  Erythematous, swollen turbinates.  No maxillary, frontal sinus tenderness.  Right TM erythematous, but not dull or bulging. Neck: No appreciable cervical lymphadenopathy Respiratory: Normal inspiratory effort, lungs clear bilaterally.  Positive anterior and lateral chest wall tenderness Cardiovascular: Normal rate, regular rhythm, no murmurs, rubs, gallops GI: nondistended skin: No rash, skin intact Musculoskeletal: no deformities Neurologic: Alert & oriented x 3, no focal neuro deficits Psychiatric: Speech and behavior appropriate   ED Course   Medications - No data to display  Orders Placed This Encounter  Procedures   DG Chest 2 View    Standing Status:   Standing    Number of Occurrences:   1    Order Specific Question:   Reason for  Exam (SYMPTOM  OR DIAGNOSIS REQUIRED)    Answer:   Cough   EKG 12-Lead    Standing Status:   Standing    Number of Occurrences:   1    No results found for this or any previous visit (from the past 24 hour(s)). DG Chest 2 View  Result Date: 01/03/2022 CLINICAL DATA:  Cough, body aches EXAM: CHEST - 2 VIEW COMPARISON:  01/02/2017 FINDINGS: Cardiac and mediastinal contours are within normal limits. No focal pulmonary opacity. No pleural effusion or pneumothorax. No acute osseous abnormality. IMPRESSION: No acute cardiopulmonary process. Electronically Signed   By: Merilyn Baba M.D.   On: 01/03/2022 17:37    ED Clinical Impression  1. Chest pain, unspecified type   2. Acute non-recurrent pansinusitis      ED Assessment/Plan  Reviewed imaging independently.  Normal chest x-ray.  See radiology report for full details.  1.  Fevers, body aches, chest congestion and cough for 6 days: Suspected sinusitis plus or minus a right-sided otitis media.  She may have originally had COVID or flu, but given duration of symptoms, I am concerned that she has a secondary infection.  Her chest x-ray is negative for pneumonia.  Prescribing 10 days of Augmentin for sinusitis/otitis.   2.  Chest pain: Story is concerning for cardiac origin versus hypertensive urgency. Patient's blood pressure is elevated, but it could be because she ran out of her blood pressure medications yesterday and has been taking decongestions.    EKG: Normal sinus rhythm, rate 75.  Normal axis, prolonged QT.  No hypertrophy.  No ST-T wave changes.  Prolonged QT new since EKG from 2017.  She was asymptomatic while EKG was obtained.  Chest pain: HEART score:   History: Moderately suspicious +1 EKG: Normal +0 Age: Age 74+1 Risk factors: 3: Hypertension, BMI above 30, positive family history +2 Troponin: Not available  Total score 4.  She is currently asymptomatic.  Transferring to the ED for further work-up of her chest pain.  Feel  that she is stable to go by private vehicle.  She is currently asymptomatic and her EKG is normal.  Discussed imaging, rationale for transfer to the emergency department with patient and spouse.  They agree to go.   Meds ordered this encounter  Medications   amoxicillin-clavulanate (AUGMENTIN) 875-125 MG tablet    Sig: Take 1 tablet by mouth 2 (two) times daily for 10 days.    Dispense:  20 tablet    Refill:  0      *This clinic note was created using [employer]. Therefore, there may be occasional mistakes despite careful proofreading.  ?    Melynda Ripple, MD 01/03/22 1821    Melynda Ripple, MD 01/04/22 1226

## 2022-08-22 ENCOUNTER — Ambulatory Visit
Admission: EM | Admit: 2022-08-22 | Discharge: 2022-08-22 | Disposition: A | Discharge status: Home / Self Care | Payer: BC Managed Care – PPO | Attending: Internal Medicine | Admitting: Internal Medicine

## 2022-08-22 DIAGNOSIS — U071 COVID-19: Secondary | ICD-10-CM | POA: Insufficient documentation

## 2022-08-22 LAB — RESP PANEL BY RT-PCR (FLU A&B, COVID) ARPGX2
Influenza A by PCR: NEGATIVE
Influenza B by PCR: NEGATIVE
SARS Coronavirus 2 by RT PCR: POSITIVE — AB

## 2022-08-22 LAB — GROUP A STREP BY PCR: Group A Strep by PCR: NOT DETECTED

## 2022-08-22 MED ORDER — IBUPROFEN 600 MG PO TABS: 600.0000 mg | ORAL_TABLET | Freq: Four times a day (QID) | ORAL | 0 refills | Status: AC | PRN

## 2022-08-22 MED ORDER — BENZONATATE 100 MG PO CAPS: 100.0000 mg | ORAL_CAPSULE | Freq: Three times a day (TID) | ORAL | 0 refills | Status: AC

## 2022-08-22 MED ORDER — LIDOCAINE VISCOUS HCL 2 % MT SOLN: 15.0000 mL | Freq: Four times a day (QID) | OROMUCOSAL | 0 refills | Status: AC | PRN

## 2022-08-22 NOTE — Discharge Instructions (Signed)
Please increase oral fluid intake Take medications as prescribed If you have worsening shortness of breath cough or sputum production please return to urgent care to be reevaluated.

## 2022-08-22 NOTE — ED Triage Notes (Signed)
Pt c/o cough, nasal congestion, diarrhea, eye drainage x6days.  Pt asks for a strep test to be done.

## 2022-08-23 NOTE — ED Provider Notes (Signed)
[facility]    CSN: [id number] Arrival date & time: 08/22/22  2947      History   Chief Complaint Chief Complaint  Patient presents with   Cough    HPI Michaela Chambers is a 46 y.o. female comes to urgent care with a 6 to 7-day history of nasal congestion, nonproductive cough, purulent eye drainage and diarrhea.  Patient endorses exposure to her mother in law who had similar symptoms.  Patient denies any fever.  Nasal congestion and eye discharge as well as diarrhea has improved.  Patient continues to have nonproductive cough.  She denies any shortness of breath or wheezing.  No dizziness, near syncope or syncopal episodes.  Patient is fully vaccinated against COVID-19 virus.  HPI  Past Medical History:  Diagnosis Date   Hypertension    Renal disorder     There are no problems to display for this patient.   Past Surgical History:  Procedure Laterality Date   CESAREAN SECTION     CHOLECYSTECTOMY      OB History   No obstetric history on file.      Home Medications    Prior to Admission medications   Medication Sig Start Date End Date Taking? Authorizing Provider  benzonatate (TESSALON) 100 MG capsule Take 1 capsule (100 mg total) by mouth every 8 (eight) hours. 08/22/22  Yes Shadow Stiggers, Britta Mccreedy, MD  ibuprofen (ADVIL) 600 MG tablet Take 1 tablet (600 mg total) by mouth every 6 (six) hours as needed. 08/22/22  Yes Maddy Graham, Britta Mccreedy, MD  magic mouthwash (lidocaine, diphenhydrAMINE, alum & mag hydroxide) suspension Swish and swallow 15 mLs 4 (four) times daily as needed for mouth pain. Compounding formula: Maalox-80 mL, viscous lidocaine 2%-80 mL, Benadryl 12.5 mg/ML-80 mg. 08/22/22  Yes Daeveon Zweber, Britta Mccreedy, MD    Family History Family History  Problem Relation Age of Onset   Diabetes Mother    Obesity Mother    Hyperlipidemia Mother    Hypertension Mother    Other Father        MVA    Social History Social History   Tobacco Use   Smoking status: Never    Smokeless tobacco: Never  Vaping Use   Vaping Use: Never used  Substance Use Topics   Alcohol use: Not Currently    Comment: "not often"   Drug use: No     Allergies   Pineapple   Review of Systems Review of Systems  HENT:  Positive for congestion and sore throat.   Respiratory:  Positive for cough. Negative for shortness of breath and wheezing.   Cardiovascular:  Positive for chest pain.  Gastrointestinal:  Positive for diarrhea.  Musculoskeletal:  Positive for myalgias.  Neurological: Negative.      Physical Exam Triage Vital Signs ED Triage Vitals  Enc Vitals Group     BP 08/22/22 1103 (!) 153/116     Pulse Rate 08/22/22 1103 64     Resp 08/22/22 1103 18     Temp 08/22/22 1103 98.3 F (36.8 C)     Temp Source 08/22/22 1103 Oral     SpO2 08/22/22 1103 100 %     Weight 08/22/22 1058 220 lb (99.8 kg)     Height 08/22/22 1058 5\' 6"  (1.676 m)     Head Circumference --      Peak Flow --      Pain Score 08/22/22 1057 10     Pain Loc --      Pain  Edu? --      Excl. in GC? --    No data found.  Updated Vital Signs BP (!) 153/116 (BP Location: Left Arm)   Pulse 64   Temp 98.3 F (36.8 C) (Oral)   Resp 18   Ht 5\' 6"  (1.676 m)   Wt 99.8 kg   LMP 08/20/2022   SpO2 100%   BMI 35.51 kg/m   Visual Acuity Right Eye Distance:   Left Eye Distance:   Bilateral Distance:    Right Eye Near:   Left Eye Near:    Bilateral Near:     Physical Exam Vitals and nursing note reviewed.  HENT:     Right Ear: Tympanic membrane normal.     Left Ear: Tympanic membrane normal.     Mouth/Throat:     Mouth: Mucous membranes are moist.     Pharynx: No posterior oropharyngeal erythema.  Cardiovascular:     Rate and Rhythm: Normal rate and regular rhythm.     Pulses: Normal pulses.     Heart sounds: Normal heart sounds.  Pulmonary:     Effort: Pulmonary effort is normal.     Breath sounds: Normal breath sounds.  Abdominal:     General: Bowel sounds are normal.      Palpations: Abdomen is soft.      UC Treatments / Results  Labs (all labs ordered are listed, but only abnormal results are displayed) Labs Reviewed  RESP PANEL BY RT-PCR (FLU A&B, COVID) ARPGX2 - Abnormal; Notable for the following components:      Result Value   SARS Coronavirus 2 by RT PCR POSITIVE (*)    All other components within normal limits  GROUP A STREP BY PCR    EKG   Radiology No results found.  Procedures Procedures (including critical care time)  Medications Ordered in UC Medications - No data to display  Initial Impression / Assessment and Plan / UC Course  I have reviewed the triage vital signs and the nursing notes.  Pertinent labs & imaging results that were available during my care of the patient were reviewed by me and considered in my medical decision making (see chart for details).     1.  COVID-19 infection: Group a PCR test is negative COVID-19 PCR test is positive Patient is outside the window for antivirals Ibuprofen as needed for pain and/or fever Mouthwash as needed for sore throat Tessalon Perles as needed for cough Maintain adequate hydration Return precautions given. Final Clinical Impressions(s) / UC Diagnoses   Final diagnoses:  COVID-19 virus infection     Discharge Instructions      Please increase oral fluid intake Take medications as prescribed If you have worsening shortness of breath cough or sputum production please return to urgent care to be reevaluated.   ED Prescriptions     Medication Sig Dispense Auth. Provider   ibuprofen (ADVIL) 600 MG tablet Take 1 tablet (600 mg total) by mouth every 6 (six) hours as needed. 30 tablet Whitten Andreoni, 10/20/2022, MD   magic mouthwash (lidocaine, diphenhydrAMINE, alum & mag hydroxide) suspension Swish and swallow 15 mLs 4 (four) times daily as needed for mouth pain. Compounding formula: Maalox-80 mL, viscous lidocaine 2%-80 mL, Benadryl 12.5 mg/ML-80 mg. 240 mL Waynetta Metheny, Britta Mccreedy,  MD   benzonatate (TESSALON) 100 MG capsule Take 1 capsule (100 mg total) by mouth every 8 (eight) hours. 21 capsule Hser Belanger, Britta Mccreedy, MD      PDMP not reviewed this encounter.  Chase Picket, MD 08/23/22 [phone]

## 2022-10-25 IMAGING — CR DG CHEST 2V
2 series · 2 of 2 positions shown · non-contrast
Comparison: 01/02/2017

CLINICAL DATA: Cough, body aches

EXAM:
CHEST - 2 VIEW

[chest pa]
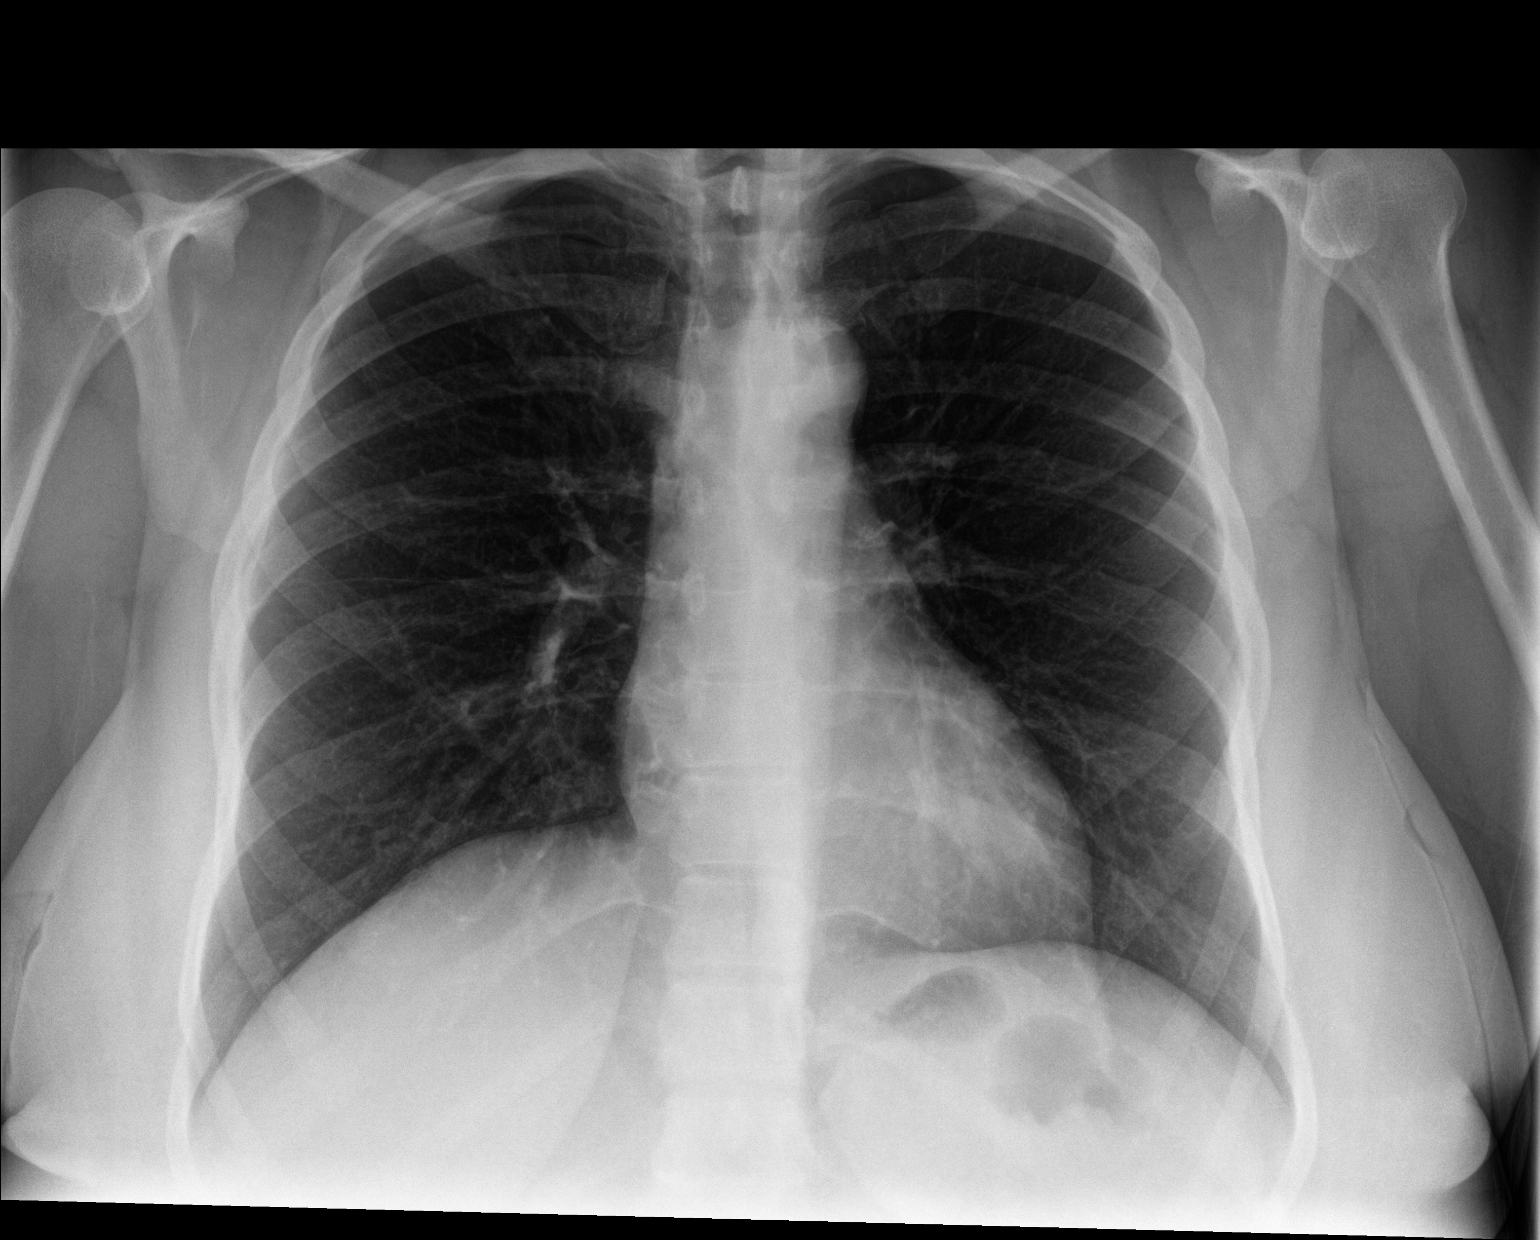

[chest lat]
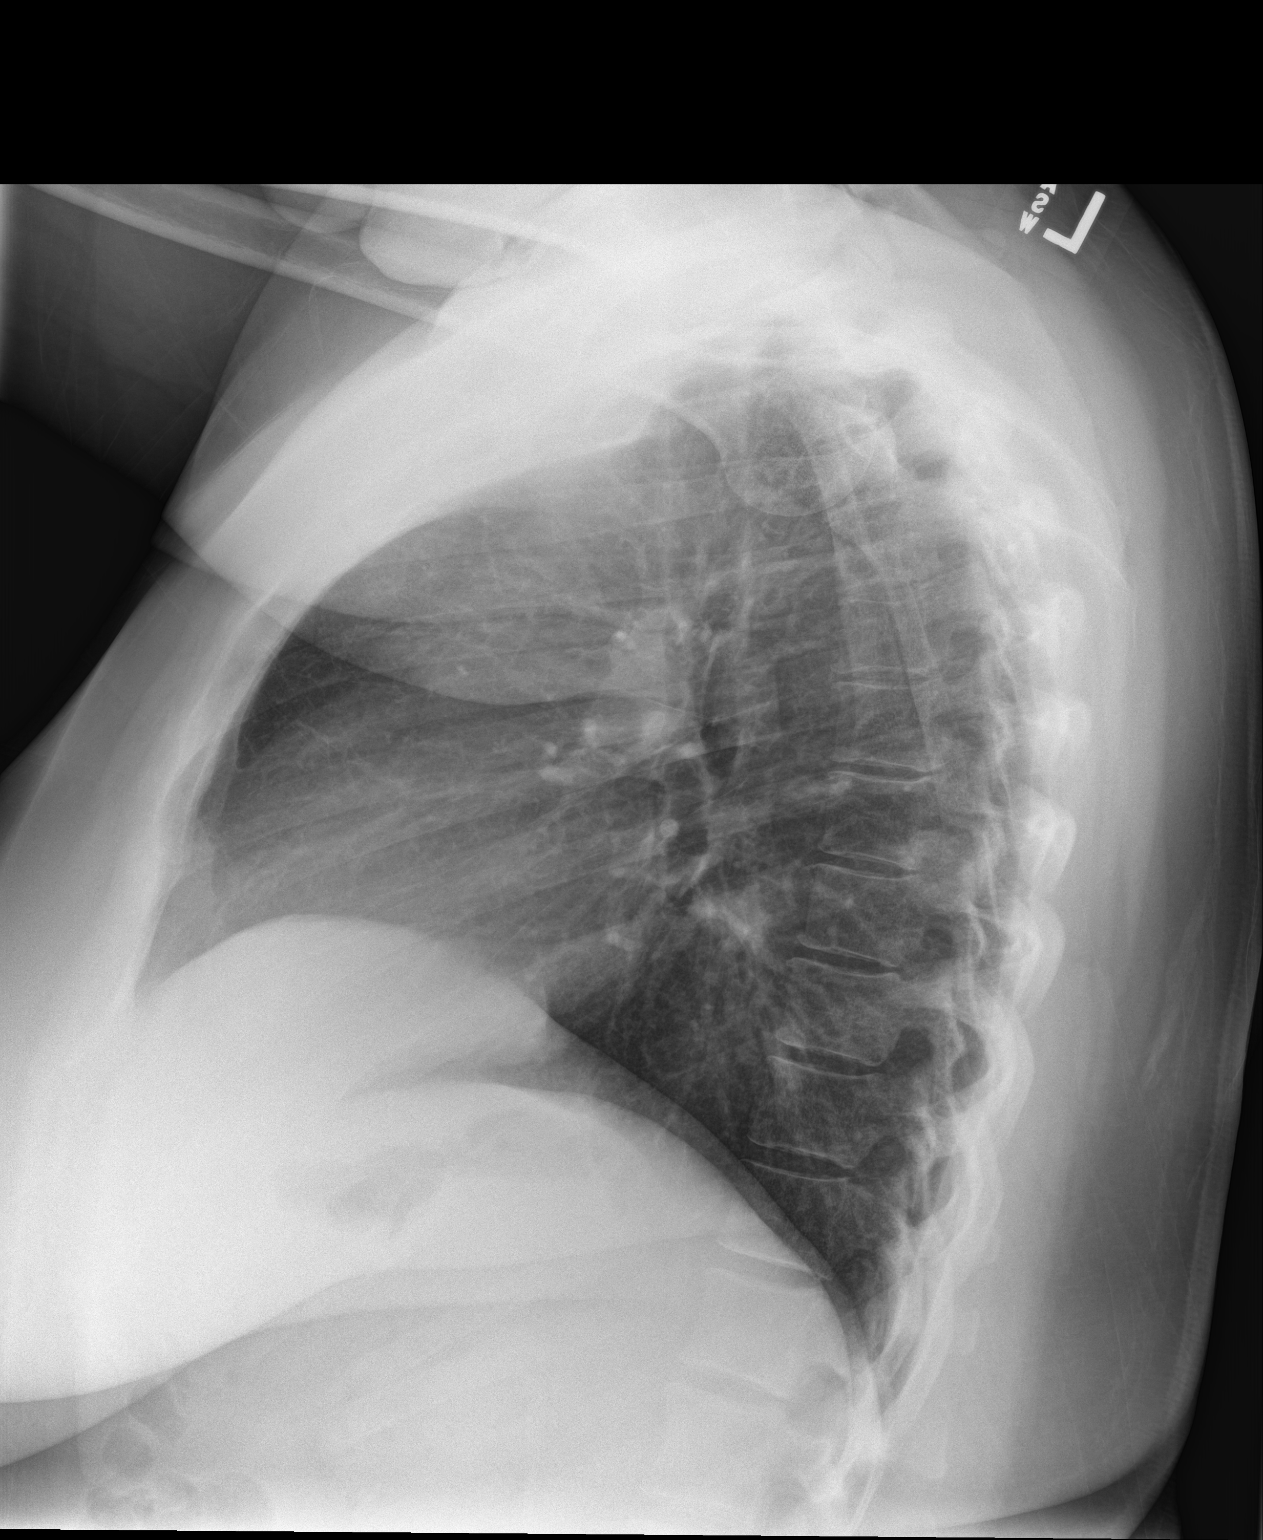

[2 of 2 positions shown; findings below may reference images not displayed]

FINDINGS: Cardiac and mediastinal contours are within normal limits. No focal
pulmonary opacity. No pleural effusion or pneumothorax. No acute
osseous abnormality.
IMPRESSION: No acute cardiopulmonary process.

## 2024-03-03 ENCOUNTER — Emergency Department
Admission: EM | Admit: 2024-03-03 | Discharge: 2024-03-03 | Discharge status: Against Medical Advice | Attending: Student | Admitting: Student

## 2024-03-03 ENCOUNTER — Other Ambulatory Visit: Payer: Self-pay

## 2024-03-03 DIAGNOSIS — Z3A00 Weeks of gestation of pregnancy not specified: Secondary | ICD-10-CM | POA: Diagnosis not present

## 2024-03-03 DIAGNOSIS — O26891 Other specified pregnancy related conditions, first trimester: Secondary | ICD-10-CM | POA: Diagnosis present

## 2024-03-03 DIAGNOSIS — R109 Unspecified abdominal pain: Secondary | ICD-10-CM | POA: Diagnosis not present

## 2024-03-03 DIAGNOSIS — Z5321 Procedure and treatment not carried out due to patient leaving prior to being seen by health care provider: Secondary | ICD-10-CM | POA: Diagnosis not present

## 2024-03-03 LAB — CBC WITH DIFFERENTIAL/PLATELET
Abs Immature Granulocytes: 0.02 10*3/uL (ref 0.00–0.07)
Basophils Absolute: 0 10*3/uL (ref 0.0–0.1)
Basophils Relative: 0 %
Eosinophils Absolute: 0.1 10*3/uL (ref 0.0–0.5)
Eosinophils Relative: 1 %
HCT: 31.8 % — ABNORMAL LOW (ref 36.0–46.0)
Hemoglobin: 9.3 g/dL — ABNORMAL LOW (ref 12.0–15.0)
Immature Granulocytes: 0 %
Lymphocytes Relative: 17 %
Lymphs Abs: 1.7 10*3/uL (ref 0.7–4.0)
MCH: 19.6 pg — ABNORMAL LOW (ref 26.0–34.0)
MCHC: 29.2 g/dL — ABNORMAL LOW (ref 30.0–36.0)
MCV: 66.9 fL — ABNORMAL LOW (ref 80.0–100.0)
Monocytes Absolute: 0.5 10*3/uL (ref 0.1–1.0)
Monocytes Relative: 5 %
Neutro Abs: 7.7 10*3/uL (ref 1.7–7.7)
Neutrophils Relative %: 77 %
Platelets: 402 10*3/uL — ABNORMAL HIGH (ref 150–400)
RBC: 4.75 MIL/uL (ref 3.87–5.11)
RDW: 18.6 % — ABNORMAL HIGH (ref 11.5–15.5)
Smear Review: NORMAL
WBC: 10 10*3/uL (ref 4.0–10.5)
nRBC: 0 % (ref 0.0–0.2)

## 2024-03-03 LAB — COMPREHENSIVE METABOLIC PANEL WITH GFR
ALT: 18 U/L (ref 0–44)
AST: 22 U/L (ref 15–41)
Albumin: 4 g/dL (ref 3.5–5.0)
Alkaline Phosphatase: 66 U/L (ref 38–126)
Anion gap: 8 (ref 5–15)
BUN: 17 mg/dL (ref 6–20)
CO2: 22 mmol/L (ref 22–32)
Calcium: 9.3 mg/dL (ref 8.9–10.3)
Chloride: 109 mmol/L (ref 98–111)
Creatinine, Ser: 0.69 mg/dL (ref 0.44–1.00)
GFR, Estimated: >60 mL/min (ref >60)
Glucose, Bld: 96 mg/dL (ref 70–99)
Potassium: 3.4 mmol/L — ABNORMAL LOW (ref 3.5–5.1)
Sodium: 139 mmol/L (ref 135–145)
Total Bilirubin: 1.3 mg/dL — ABNORMAL HIGH (ref 0.0–1.2)
Total Protein: 7.5 g/dL (ref 6.5–8.1)

## 2024-03-03 LAB — URINALYSIS, ROUTINE W REFLEX MICROSCOPIC
Bilirubin Urine: NEGATIVE
Glucose, UA: NEGATIVE mg/dL
Hgb urine dipstick: NEGATIVE
Ketones, ur: NEGATIVE mg/dL
Nitrite: NEGATIVE
Protein, ur: NEGATIVE mg/dL
Specific Gravity, Urine: 1.006 (ref 1.005–1.030)
pH: 6 (ref 5.0–8.0)

## 2024-03-03 LAB — HCG, QUANTITATIVE, PREGNANCY: hCG, Beta Chain, Quant, S: <1 m[IU]/mL (ref <5)

## 2024-03-03 LAB — LIPASE, BLOOD: Lipase: 30 U/L (ref 11–51)

## 2024-03-03 LAB — PREGNANCY, URINE: Preg Test, Ur: POSITIVE — AB

## 2024-03-03 LAB — POC URINE PREG, ED: Preg Test, Ur: POSITIVE — AB

## 2024-03-03 NOTE — ED Provider Triage Note (Signed)
 Emergency Medicine Provider Triage Evaluation Note  Michaela Chambers , a 48 y.o. female  was evaluated in triage.  Pt complains of abdominal cramping for 3 days and no period for 2 months.  Review of Systems  Positive: Abdominal pain Negative:   Physical Exam  BP (!) 147/110 (BP Location: Right Arm)   Pulse (!) 113   Temp 98.3 F (36.8 C) (Oral)   Resp 16   Ht 5\' 6"  (1.676 m)   Wt 99.8 kg   SpO2 95%   BMI 35.51 kg/m  Gen:   Awake, no distress   Resp:  Normal effort  MSK:   Moves extremities without difficulty  Other:    Medical Decision Making  Medically screening exam initiated at 5:20 PM.  Appropriate orders placed.  Michaela Chambers was informed that the remainder of the evaluation will be completed by another provider, this initial triage assessment does not replace that evaluation, and the importance of remaining in the ED until their evaluation is complete.     Phyliss Breen, PA-C 03/03/24 1723

## 2024-03-03 NOTE — ED Triage Notes (Signed)
 Pt presents to ED from home C/O abdominal cramping X 3 days and missed period X 2 months. Pt states she is here for a pregnancy test, has not taken an at home test. Refuses blood work, only wants urine pregnancy test.

## 2024-03-03 NOTE — ED Notes (Signed)
 Pt approached nurse and said she was leaving to take kid home. This RN informed pt that she needed to stay to get seen or they would take her off board if she didn't answer. Pt states she is leaving and will be back.

## 2024-05-02 ENCOUNTER — Encounter: Payer: Self-pay | Admitting: Emergency Medicine

## 2024-05-02 ENCOUNTER — Emergency Department
Admission: EM | Admit: 2024-05-02 | Discharge: 2024-05-02 | Disposition: A | Discharge status: Home / Self Care | Attending: Emergency Medicine | Admitting: Emergency Medicine

## 2024-05-02 ENCOUNTER — Emergency Department

## 2024-05-02 ENCOUNTER — Other Ambulatory Visit: Payer: Self-pay

## 2024-05-02 DIAGNOSIS — N939 Abnormal uterine and vaginal bleeding, unspecified: Secondary | ICD-10-CM | POA: Insufficient documentation

## 2024-05-02 DIAGNOSIS — R102 Pelvic and perineal pain: Secondary | ICD-10-CM | POA: Diagnosis not present

## 2024-05-02 LAB — CBC
HCT: 31.3 % — ABNORMAL LOW (ref 36.0–46.0)
Hemoglobin: 9 g/dL — ABNORMAL LOW (ref 12.0–15.0)
MCH: 19.5 pg — ABNORMAL LOW (ref 26.0–34.0)
MCHC: 28.8 g/dL — ABNORMAL LOW (ref 30.0–36.0)
MCV: 67.9 fL — ABNORMAL LOW (ref 80.0–100.0)
Platelets: 378 10*3/uL (ref 150–400)
RBC: 4.61 MIL/uL (ref 3.87–5.11)
RDW: 17.9 % — ABNORMAL HIGH (ref 11.5–15.5)
WBC: 8 10*3/uL (ref 4.0–10.5)
nRBC: 0 % (ref 0.0–0.2)

## 2024-05-02 LAB — URINALYSIS, ROUTINE W REFLEX MICROSCOPIC
Bilirubin Urine: NEGATIVE
Glucose, UA: NEGATIVE mg/dL
Ketones, ur: NEGATIVE mg/dL
Leukocytes,Ua: NEGATIVE
Nitrite: NEGATIVE
Protein, ur: NEGATIVE mg/dL
Specific Gravity, Urine: 1.002 — ABNORMAL LOW (ref 1.005–1.030)
pH: 6 (ref 5.0–8.0)

## 2024-05-02 LAB — HCG, QUANTITATIVE, PREGNANCY: hCG, Beta Chain, Quant, S: <1 m[IU]/mL (ref <5)

## 2024-05-02 LAB — POC URINE PREG, ED: Preg Test, Ur: NEGATIVE

## 2024-05-02 NOTE — ED Triage Notes (Signed)
 Pt presents to the ER with complaints of vaginal bleeding. Pt reports she noticed the bleeding prior to coming to ER. Pt reports she is pregnant last period back in March. Pt talks in complete sentences no respiratory distress noted

## 2024-05-02 NOTE — Discharge Instructions (Addendum)
 Follow-up with OB/GYN for further evaluation of your symptoms.  Return to the ER for new or worsening symptoms.

## 2024-05-02 NOTE — ED Provider Notes (Signed)
 [facility] Provider Note    Event Date/Time   First MD Initiated Contact with Patient 05/02/24 2132     (approximate)   History   Vaginal Bleeding   HPI  Michaela Chambers is a 48 year old female presenting to the emergency department for evaluation of vaginal bleeding.  LMP at the end of March.  Patient presented to the ER in April where she had a positive urine pregnancy test, but did not stay for further evaluation.  She presents today as she had a small amount of vaginal bleeding, less than her normal menstrual cycle.  Mild lower abdominal pain.     Physical Exam   Triage Vital Signs: ED Triage Vitals  Encounter Vitals Group     BP 05/02/24 2114 (!) 178/117     Girls Systolic BP Percentile --      Girls Diastolic BP Percentile --      Boys Systolic BP Percentile --      Boys Diastolic BP Percentile --      Pulse Rate 05/02/24 2114 63     Resp 05/02/24 2114 16     Temp 05/02/24 2114 98.8 F (37.1 C)     Temp Source 05/02/24 2114 Oral     SpO2 05/02/24 2114 100 %     Weight 05/02/24 2116 210 lb (95.3 kg)     Height 05/02/24 2116 5' 7 (1.702 m)     Head Circumference --      Peak Flow --      Pain Score 05/02/24 2115 8     Pain Loc --      Pain Education --      Exclude from Growth Chart --     Most recent vital signs: Vitals:   05/02/24 2114  BP: (!) 178/117  Pulse: 63  Resp: 16  Temp: 98.8 F (37.1 C)  SpO2: 100%     General: Awake, interactive  CV:  Regular rate, good peripheral perfusion.  Resp:  Unlabored respirations.  Abd:  Nondistended.  GU:  Declined Neuro:  Symmetric facial movement, fluid speech    ED Results / Procedures / Treatments   Labs (all labs ordered are listed, but only abnormal results are displayed) Labs Reviewed  CBC - Abnormal; Notable for the following components:      Result Value   Hemoglobin 9.0 (*)    HCT 31.3 (*)    MCV 67.9 (*)    MCH 19.5 (*)    MCHC 28.8 (*)    RDW 17.9 (*)    All  other components within normal limits  URINALYSIS, ROUTINE W REFLEX MICROSCOPIC - Abnormal; Notable for the following components:   Color, Urine STRAW (*)    APPearance CLEAR (*)    Specific Gravity, Urine 1.002 (*)    Hgb urine dipstick LARGE (*)    Bacteria, UA RARE (*)    All other components within normal limits  HCG, QUANTITATIVE, PREGNANCY  POC URINE PREG, ED     EKG EKG independently reviewed and interpreted by myself demonstrates:    RADIOLOGY Imaging independently reviewed and interpreted by myself demonstrates:  Ultrasound limited as patient declined endovaginal component, but no abnormality was noted within the uterus or endometrium with thin endometrial stripe  Formal Radiology Read:  US  Pelvis Complete Result Date: 05/02/2024 CLINICAL DATA:  Pelvic pain, vaginal bleeding, recent pregnancy reportedly documented 03/03/2024 but now with negative beta HCG. Evaluate for retained products of conception. EXAM: TRANSABDOMINAL ULTRASOUND OF PELVIS TECHNIQUE:  Transabdominal ultrasound examination of the pelvis was performed including evaluation of the uterus, ovaries, adnexal regions, and pelvic cul-de-sac. COMPARISON:  None.  Also, the patient declined endovaginal scanning. FINDINGS: Uterus Measurements: Anteverted measuring 9.4 x 5.4 x 6.0 cm = volume: 158.3 mL. No fibroids or other mass visualized. Endometrium Thickness: 7.9. No focal abnormality visualized, but was not interrogated for Doppler flow and not optimally demonstrated due to bowel gas shadowing. Right ovary Not visualized. Left ovary Not visualized. Other findings:  No abnormal free fluid. IMPRESSION: 1. No focal abnormality visualized in the uterus or endometrium, but the endometrium was not interrogated for Doppler flow and not optimally demonstrated due to bowel gas shadowing. The patient declined endovaginal scanning at this time. 2. Nonvisualized ovaries. 3. No abnormal free fluid. Electronically Signed   By: Denman Fischer M.D.   On: 05/02/2024 22:41    PROCEDURES:  Critical Care performed: No  Procedures   MEDICATIONS ORDERED IN ED: Medications - No data to display   IMPRESSION / MDM / ASSESSMENT AND PLAN / ED COURSE  I reviewed the triage vital signs and the nursing notes.  Differential diagnosis includes, but is not limited to, threatened miscarriage, spontaneous abortion, recurrent menstrual cycle, ectopic pregnancy, initial false positive pregnancy test  Patient's presentation is most consistent with acute presentation with potential threat to life or bodily function.  48 year old female presenting to the emergency department for evaluation of vaginal bleeding.  Stable vitals on presentation.  Labs with stable anemia.  UPT negative.  hCG under 1.  Urine without evidence of infection.  I did go back and review patient's workup from 4/14.  At that time she did have a documented positive urine pregnancy test, but her beta hCG was under 1, question if this could have been a false positive urine pregnancy test and patient did not stay for provider evaluation at that time.  Ultrasound was he obtained here to evaluate for retained products given clinical history.  This was without acute abnormality or evidence of pregnancy.  Patient updated on results of workup.  She was given information for follow-up with OB/GYN.  Strict return precautions provided.  Patient discharged in stable condition.      FINAL CLINICAL IMPRESSION(S) / ED DIAGNOSES   Final diagnoses:  Vaginal bleeding     Rx / DC Orders   ED Discharge Orders     None        Note:  This document was prepared using Dragon voice recognition software and may include unintentional dictation errors.   Claria Crofts, MD 05/02/24 [phone]

## 2024-09-25 ENCOUNTER — Emergency Department
Admission: EM | Admit: 2024-09-25 | Discharge: 2024-09-25 | Disposition: A | Discharge status: Home / Self Care | Attending: Emergency Medicine | Admitting: Emergency Medicine

## 2024-09-25 ENCOUNTER — Other Ambulatory Visit: Payer: Self-pay

## 2024-09-25 DIAGNOSIS — T782XXA Anaphylactic shock, unspecified, initial encounter: Secondary | ICD-10-CM | POA: Insufficient documentation

## 2024-09-25 DIAGNOSIS — T7840XA Allergy, unspecified, initial encounter: Secondary | ICD-10-CM | POA: Insufficient documentation

## 2024-09-25 DIAGNOSIS — R0602 Shortness of breath: Secondary | ICD-10-CM | POA: Diagnosis present

## 2024-09-25 DIAGNOSIS — L509 Urticaria, unspecified: Secondary | ICD-10-CM

## 2024-09-25 MED ORDER — EPINEPHRINE 0.3 MG/0.3ML IJ SOAJ: 0.3000 mg | Freq: Once | INTRAMUSCULAR | 0 refills | Status: AC

## 2024-09-25 MED ORDER — EPINEPHRINE 0.3 MG/0.3ML IJ SOAJ
INTRAMUSCULAR | Status: AC
Start: 2024-09-25 — End: 2024-09-25
  Administered 2024-09-25: 0.3 mg
  Filled 2024-09-25: qty 0.3

## 2024-09-25 MED ORDER — PREDNISONE 20 MG PO TABS: 60.0000 mg | ORAL_TABLET | Freq: Every day | ORAL | 0 refills | Status: AC

## 2024-09-25 MED ORDER — METHYLPREDNISOLONE SODIUM SUCC 125 MG IJ SOLR
125.0000 mg | Freq: Once | INTRAMUSCULAR | Status: AC
Start: 2024-09-25 — End: 2024-09-25
  Administered 2024-09-25: 125 mg via INTRAVENOUS
  Filled 2024-09-25: qty 2

## 2024-09-25 MED ORDER — HYDROXYZINE HCL 25 MG PO TABS
25.0000 mg | ORAL_TABLET | Freq: Once | ORAL | Status: AC
  Administered 2024-09-25: 25 mg via ORAL
  Filled 2024-09-25: qty 1

## 2024-09-25 MED ORDER — LORATADINE 10 MG PO TABS
10.0000 mg | ORAL_TABLET | Freq: Once | ORAL | Status: AC
  Administered 2024-09-25: 10 mg via ORAL
  Filled 2024-09-25: qty 1

## 2024-09-25 MED ORDER — FAMOTIDINE IN NACL 20-0.9 MG/50ML-% IV SOLN
20.0000 mg | Freq: Once | INTRAVENOUS | Status: AC
  Administered 2024-09-25: 20 mg via INTRAVENOUS
  Filled 2024-09-25: qty 50

## 2024-09-25 NOTE — ED Provider Notes (Signed)
 [facility] Provider Note    Event Date/Time   First MD Initiated Contact with Patient 09/25/24 [phone]     (approximate)   History   Allergic Reaction  The patient and/or family speak(s) Spanish.  They understand they have the right to the use of a hospital interpreter, however at this time they prefer to speak directly with me in Spanish.  They know that they can ask for an interpreter at any time.   HPI Michaela Chambers is a 48 y.o. female with a history of multiple allergies who presents with acute onset allergic reaction with shortness of breath and throat tightening.  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  Within minutes of eating and getting ready for bed, she developed a rash on her face, neck, chest, and back that is itching and swollen and she developed swelling in her throat, difficulty speaking and swallowing, and shortness of breath.  She took what is thought to be 50 mg of oral Tylenol  at home and then came immediately to the emergency department.  Onset of symptoms was about 20 minutes prior to arrival.  She has been to the emergency department multiple times in the past for similar allergic reactions and has previously received an EpiPen.     Physical Exam   Triage Vital Signs: ED Triage Vitals  Encounter Vitals Group     BP 09/25/24 0217 (!) 170/106     Girls Systolic BP Percentile --      Girls Diastolic BP Percentile --      Boys Systolic BP Percentile --      Boys Diastolic BP Percentile --      Pulse Rate 09/25/24 0217 76     Resp 09/25/24 0217 20     Temp 09/25/24 0217 98.4 F (36.9 C)     Temp src --      SpO2 09/25/24 0217 100 %     Weight 09/25/24 0216 97.5 kg (215 lb)     Height 09/25/24 0216 1.727 m (5' 8)     Head Circumference --      Peak Flow --      Pain Score 09/25/24 0216 0     Pain Loc --      Pain  Education --      Exclude from Growth Chart --     Most recent vital signs: Vitals:   09/25/24 0217  BP: (!) 170/106  Pulse: 76  Resp: 20  Temp: 98.4 F (36.9 C)  SpO2: 100%    General: Awake,  Alert, mild distress and very anxious. HEENT: Patient has urticarial rash on her face and anterior neck as well as on her upper back and upper chest.  There is no visible angioedema and she is able to open her mouth with no trismus and a patent airway.  She initially was speaking in a quieter voice than normal but as she was speaking to me it got better.  She has no stridor.  CV:  Good peripheral perfusion.  Regular rate and rhythm, normal heart sounds. Resp:  Normal effort. Speaking easily and comfortably, no accessory muscle usage nor intercostal retractions.  Lungs are clear to auscultation with no wheezing, rales, nor rhonchi. Abd:  No distention.  No tenderness to palpation of the abdomen with no recent nausea or vomiting.   ED Results /  Procedures / Treatments   Labs (all labs ordered are listed, but only abnormal results are displayed) Labs Reviewed - No data to display     PROCEDURES:  Critical Care performed: No  .Critical Care  Performed by: Gordan Huxley, MD Authorized by: Gordan Huxley, MD   Critical care provider statement:    Critical care time (minutes):  30   Critical care was necessary to treat or prevent imminent or life-threatening deterioration of the following conditions: Anaphylaxis.   Critical care was time spent personally by me on the following activities:  Development of treatment plan with patient or surrogate, evaluation of patient's response to treatment, examination of patient, ordering and performing treatments and interventions, pulse oximetry, re-evaluation of patient's condition and review of old charts     IMPRESSION / MDM / ASSESSMENT AND PLAN / ED COURSE  I reviewed the triage vital signs and the nursing notes.                               Differential diagnosis includes, but is not limited to, anaphylaxis, allergic reaction, less likely infection  Patient's presentation is most consistent with acute presentation with potential threat to life or bodily function.   Interventions/Medications given:  Medications  EPINEPHrine (EPI-PEN) 0.3 mg/0.3 mL injection (0.3 mg  Given 09/25/24 0243)  methylPREDNISolone sodium succinate (SOLU-MEDROL) 125 mg/2 mL injection 125 mg (125 mg Intravenous Given 09/25/24 0249)  famotidine  (PEPCID ) IVPB 20 mg premix (0 mg Intravenous Stopped 09/25/24 0341)  loratadine (CLARITIN) tablet 10 mg (10 mg Oral Given 09/25/24 0247)  hydrOXYzine (ATARAX) tablet 25 mg (25 mg Oral Given 09/25/24 0300)    (Note:  hospital course my include additional interventions and/or labs/studies not listed above.)   Patient is hypertensive but overall reassuring vitals.  She is very anxious as well as appearing to have an anaphylactic reaction although the symptoms are relatively mild she feels short of breath and with throat tightening.  I personally administered an EpiPen and then ordered the other medications as listed above.  Patient and her husband know that we will monitor for at least 4 hours.  No indication for emergent airway intervention at this time.     Clinical Course as of 09/25/24 0435  Thu Sep 25, 2024  0258 Patient breathing better but reporting worsening itching, I ordered Atarax 25 mg p.o. [CF]  0432 The patient's symptoms have completely resolved and she and her husband want to go home.  I reminded them that patients with anaphylaxis should stay at least 4 hours to make sure that her symptoms do not come back.  She has been through this several times in the past and said that this is never happened and that she wants to go home.  They have the capacity to make their own decisions and they understand that she may have rebound symptoms and knows to come back immediately if that happens.  I wrote  prescriptions as listed below and recommended close follow-up.  They understand they are leaving against my medical advice but I will discharge them since they have the ability to make their own decisions. [CF]    Clinical Course User Index [CF] Gordan Huxley, MD     FINAL CLINICAL IMPRESSION(S) / ED DIAGNOSES   Final diagnoses:  Anaphylaxis, initial encounter  Allergic reaction, initial encounter  Urticaria     Rx / DC Orders   ED Discharge Orders  Ordered    EPINEPHrine (EPIPEN 2-PAK) 0.3 mg/0.3 mL IJ SOAJ injection   Once        09/25/24 0434    predniSONE (DELTASONE) 20 MG tablet  Daily with breakfast        09/25/24 0434             Note:  This document was prepared using Dragon voice recognition software and may include unintentional dictation errors.   Gordan Huxley, MD 09/25/24 [phone]

## 2024-09-25 NOTE — Discharge Instructions (Addendum)
 You have been seen in the Emergency Department (ED) today for an allergic reaction.  You have been stable throughout your stay in the Emergency Department.  Please take your medications as prescribed and follow up with your doctor as indicated.  You should also take a daily cetirizine (Zyrtec) which should help control your allergic symptoms.  Please keep your Epi-Pen with you at all times and use it if experience shortness of breath or difficulty breathing or if you believe you are having a severe allergic reaction.  If you use the Epi-Pen, though, please call 911 afterwards or go immediately to your nearest Emergency Department.  Return to the Emergency Department (ED) if you experience any worsening or new symptoms that concern you.

## 2024-09-25 NOTE — ED Notes (Signed)
Itching resolved  

## 2024-09-25 NOTE — ED Triage Notes (Signed)
 Pt reports hives that began tonight, pt reports she is allergic to pineapple and may have eaten some in her dinner tonight. Pt took benadryl pta, pt also reports throat tightness.
